# Patient Record
Sex: Male | Born: 1975 | Hispanic: Yes | Marital: Single | Smoking: Never smoker
Health system: Southern US, Community
[De-identification: ages and names within clinical notes are randomized; demographics above are authoritative.]

## PROBLEM LIST (undated history)

## (undated) DIAGNOSIS — R142 Eructation: Secondary | ICD-10-CM

## (undated) DIAGNOSIS — E785 Hyperlipidemia, unspecified: Secondary | ICD-10-CM

## (undated) DIAGNOSIS — E119 Type 2 diabetes mellitus without complications: Secondary | ICD-10-CM

## (undated) DIAGNOSIS — I10 Essential (primary) hypertension: Secondary | ICD-10-CM

## (undated) DIAGNOSIS — F419 Anxiety disorder, unspecified: Secondary | ICD-10-CM

## (undated) DIAGNOSIS — K589 Irritable bowel syndrome without diarrhea: Secondary | ICD-10-CM

## (undated) HISTORY — DX: Anxiety disorder, unspecified: F41.9

## (undated) HISTORY — DX: Irritable bowel syndrome, unspecified: K58.9

## (undated) HISTORY — DX: Essential (primary) hypertension: I10

## (undated) HISTORY — DX: Type 2 diabetes mellitus without complications: E11.9

## (undated) HISTORY — PX: CIRCUMCISION: SUR203

## (undated) HISTORY — PX: GASTRIC BYPASS: SHX52

## (undated) HISTORY — PX: ANKLE SURGERY: SHX546

## (undated) HISTORY — DX: Eructation: R14.2

## (undated) HISTORY — DX: Hyperlipidemia, unspecified: E78.5

---

## 2019-06-26 ENCOUNTER — Ambulatory Visit (INDEPENDENT_AMBULATORY_CARE_PROVIDER_SITE_OTHER): Payer: 59

## 2019-06-26 ENCOUNTER — Ambulatory Visit (INDEPENDENT_AMBULATORY_CARE_PROVIDER_SITE_OTHER): Payer: 59 | Admitting: Internal Medicine

## 2019-06-26 ENCOUNTER — Encounter: Payer: Self-pay | Admitting: *Deleted

## 2019-06-26 ENCOUNTER — Other Ambulatory Visit: Payer: Self-pay

## 2019-06-26 ENCOUNTER — Encounter: Payer: Self-pay | Admitting: Internal Medicine

## 2019-06-26 VITALS — BP 136/97 | HR 82 | Ht 66.0 in | Wt 240.5 lb

## 2019-06-26 DIAGNOSIS — R002 Palpitations: Secondary | ICD-10-CM

## 2019-06-26 DIAGNOSIS — R55 Syncope and collapse: Secondary | ICD-10-CM | POA: Insufficient documentation

## 2019-06-26 DIAGNOSIS — R0602 Shortness of breath: Secondary | ICD-10-CM

## 2019-06-26 DIAGNOSIS — R0789 Other chest pain: Secondary | ICD-10-CM | POA: Diagnosis not present

## 2019-06-26 DIAGNOSIS — R079 Chest pain, unspecified: Secondary | ICD-10-CM

## 2019-06-26 DIAGNOSIS — Z9189 Other specified personal risk factors, not elsewhere classified: Secondary | ICD-10-CM | POA: Diagnosis not present

## 2019-06-26 DIAGNOSIS — E785 Hyperlipidemia, unspecified: Secondary | ICD-10-CM

## 2019-06-26 DIAGNOSIS — E119 Type 2 diabetes mellitus without complications: Secondary | ICD-10-CM

## 2019-06-26 DIAGNOSIS — I10 Essential (primary) hypertension: Secondary | ICD-10-CM

## 2019-06-26 DIAGNOSIS — F411 Generalized anxiety disorder: Secondary | ICD-10-CM | POA: Insufficient documentation

## 2019-06-26 NOTE — Progress Notes (Signed)
New Outpatient Visit Date: 06/26/2019  Primary Care Provider: Mick Sell, MD [address] [place] [address]  Chief Complaint: Lightheadedness, chest pain, and elevated blood pressure  HPI:  Mr. Kehn is a 43 y.o. male who is being seen today for the evaluation of chest pain, palpitations, lightheadedness, shortness of breath, and elevated blood pressure as a self-referral.  He has a history of hypertension, hyperlipidemia, and type 2 diabetes mellitus.  He and his wife moved to [place] from [place], [place], ~6 weeks ago.  Mr. Fingar notes that he has been under quite a bit of stress at work and wonders if that could be contributing to his symptoms.  About 3 weeks ago, he began to feel lightheaded and weak.  He stood up and felt an uncomfortable feeling radiating from his legs up to his chest.  He then developed a "compressive" feeling in his chest.  His blood pressure at the time was 188/103.  He was seen in the [facility] ED, where workup was unrevealing other than elevated blood pressure.  He was given an extra dose of amlodipine and discharged.  He has continued to have similar episodes (albeit without chest pain), most recently last night.  EMS was called to his home yesterday and found his blood pressure to again be elevated but otherwise no objective abnormalities.  His episodes typically last 1-2 hours and are without clear precipitants.  He was given a prescription for lorazepam and sertraline by his PCP earlier today.  Mr. Mcquain notes that he was started on olmesartan 5 mg daily in July before moving to [facility] (though he did not start taking the medication until August).  It was increased to 20 mg daly by his PCP today.  He is also on amlodipine 10 mg daily.  He was previously on metformin and atorvastatin but stopped these medications recently to see if they were contributing to the aforementioned symptoms; he has not noticed any improvement.  Mr. Copland  was evaluated by a cardiologist in [facility] within the last year (Dr. Bradly Bienenstock).  Stress test and echocardiogram reportedly were unremarkable.  At one point, a cardiac catheterization was recommended but never completed as it was not approved by his insurance.  He had a gastric bypass in 2004 and lost a considerable amount of weight.  However, he has gained much of it back.  He had a sleep study at that time and reports that it was negative for OSA.  He has noticed fatigue.  His wife reports that Mr. Recio frequently snores.  --------------------------------------------------------------------------------------------------  Cardiovascular History & Procedures: Cardiovascular Problems:  Palpitations  Chest pain  Shortness of breath   Risk Factors:  Hypertension, hyperlipidemia, diabetes mellitus, male gender, and obesity  Cath/PCI:  None  CV Surgery:  None  EP Procedures and Devices:  None  Non-Invasive Evaluation(s):  Echo and stress test within the last year reportedly normal (reports not available for review)  --------------------------------------------------------------------------------------------------  Past Medical History:  Diagnosis Date  . Diabetes mellitus without complication (HCC)   . Hyperlipidemia   . Hypertension     Past Surgical History:  Procedure Laterality Date  . ANKLE SURGERY     right  . CIRCUMCISION    . GASTRIC BYPASS      Current Meds  Medication Sig  . amLODipine (NORVASC) 10 MG tablet Take by mouth daily.  . diphenhydrAMINE-APAP, sleep, (TYLENOL PM EXTRA STRENGTH PO) Take by mouth daily.  Marland Kitchen LORazepam (ATIVAN) 0.5 MG tablet Take by  mouth as needed.  Marland Kitchen olmesartan (BENICAR) 20 MG tablet Take 20 mg by mouth daily.  . pantoprazole (PROTONIX) 40 MG tablet Take by mouth 2 (two) times daily.  . sertraline (ZOLOFT) 50 MG tablet Take by mouth daily.  . Vitamin D, Ergocalciferol, (DRISDOL) 1.25 MG (50000 UT) CAPS capsule Take by mouth  once a week.    Allergies: Nsaids  Social History   Tobacco Use  . Smoking status: Never Smoker  . Smokeless tobacco: Never Used  Substance Use Topics  . Alcohol use: Yes    Alcohol/week: 40.0 standard drinks    Types: 40 Cans of beer per week  . Drug use: Never    Family History  Problem Relation Age of Onset  . Hypertension Mother   . Heart attack Father 59  . Heart disease Maternal Grandmother   . Diabetes Maternal Grandmother     Review of Systems: A 12-system review of systems was performed and was negative except as noted in the HPI.  --------------------------------------------------------------------------------------------------  Physical Exam: BP (!) 136/97 (BP Location: Right Arm, Patient Position: Sitting, Cuff Size: Large)   Pulse 82   Ht 5\' 6"  (1.676 m)   Wt 240 lb 8 oz (109.1 kg)   SpO2 98%   BMI 38.82 kg/m   General:  NAD.  Accompanied by his wife. HEENT: No conjunctival pallor or scleral icterus. Facemask in place. Neck: Supple without lymphadenopathy, thyromegaly, JVD, or HJR. No carotid bruit. Lungs: Normal work of breathing. Clear to auscultation bilaterally without wheezes or crackles. Heart: Regular rate and rhythm without murmurs, rubs, or gallops. Non-displaced PMI. Abd: Bowel sounds present. Soft, NT/ND without hepatosplenomegaly Ext: No lower extremity edema. Radial, PT, and DP pulses are 2+ bilaterally Skin: Warm and dry without rash. Neuro: CNIII-XII intact. Strength and fine-touch sensation intact in upper and lower extremities bilaterally. Psych: Normal mood and affect.  EKG:  Normal sinus rhythm without abnormality.   --------------------------------------------------------------------------------------------------  ASSESSMENT AND PLAN: Chest pain, shortness of breath, and palpitations: Episodes are happening randomly several days per week.  Palpitations, lightheadedness, and dyspnea are the most prominent features; significant  chest pain only occurred once and led to ED visit at [facility].  We will request records from his prior cardiologist in [place] before repeating any imaging.  Paroxysmal arrhythmia could cause some of these symptoms.  We have agreed to place a 14-day event monitor for further assessment.  If not already done, I recommend that his PCP obtain a TSH to exclude thyroid dysfunction.  I think it is reasonable to treat possible component of anxiety.  I will also refer him to pulmonary for sleep evaluation, given some of his symptoms as well as report of snoring.  Hypertension: BP mildly elevated today.  I agree with increasing olmesartan to 20 mg daily and continuing amlodipine 10 mg daily.  I have recommended sodium restriction and provided information about the DASH diet.  We will refer to pulmonary for consideration of sleep study.  Hyperlipidemia: No lipid panel available for review.  Patient recently stopped atorvastatin on his own.  I will defer ongoing monitoring/managment to his PCP.  Morbid obesity: BMI > 35 with multiple comorbidities (hypertension, hyperlipidemia, diabetes mellitus).  I have recommended weight loss through diet and exercise.  Type 2 diabetes mellitus: Previously on metformin but recently self-discontinues due to constellation of symptoms outline above.  It does not appear that symptoms have improved with stopping this medication.  I suggest that he restart metformin or speak with his PCP  about other treatment options.  Follow-up: RTC 1 month.  Yvonne Kendall, MD 06/27/2019 8:42 PM

## 2019-06-26 NOTE — Patient Instructions (Addendum)
Medication Instructions:  Your physician recommends that you continue on your current medications as directed. Please refer to the Current Medication list given to you today.  If you need a refill on your cardiac medications before your next appointment, please call your pharmacy.   Lab work: NONE If you have labs (blood work) drawn today and your tests are completely normal, you will receive your results only by: Marland Kitchen MyChart Message (if you have MyChart) OR . A paper copy in the mail If you have any lab test that is abnormal or we need to change your treatment, we will call you to review the results.  Testing/Procedures: Your physician has recommended that you wear an 14 DAY ZIO event monitor. Event monitors are medical devices that record the heart's electrical activity. Doctors most often [place] these monitors to diagnose arrhythmias. Arrhythmias are problems with the speed or rhythm of the heartbeat. The monitor is a small, portable device. You can wear one while you do your normal daily activities. This is usually used to diagnose what is causing palpitations/syncope (passing out). A Zio Patch Event Heart monitor will be applied to your chest today.  You will wear the patch for 14 days. After 24 hours, you may shower with the heart monitor on. If you feel any SYMPTOMS, you may press and release the button in the middle of the monitor.   Follow-Up: You have been referred to Pulmonary clinic for sleep evaluation.   At [facility], you and your health needs are our priority.  As part of our continuing mission to provide you with exceptional heart care, we have created designated Provider Care Teams.  These Care Teams include your primary Cardiologist (physician) and Advanced Practice Providers (APPs -  Physician Assistants and Nurse Practitioners) who all work together to provide you with the care you need, when you need it. You will need a follow up appointment in 1 months.  You may see DR  Harrell Gave END or one of the following Advanced Practice Providers on your designated Care Team:   Murray Hodgkins, NP Christell Faith, PA-C . Marrianne Mood, PA-C

## 2019-06-27 ENCOUNTER — Encounter: Payer: Self-pay | Admitting: Internal Medicine

## 2019-06-27 DIAGNOSIS — R002 Palpitations: Secondary | ICD-10-CM | POA: Insufficient documentation

## 2019-06-27 DIAGNOSIS — R079 Chest pain, unspecified: Secondary | ICD-10-CM | POA: Insufficient documentation

## 2019-06-27 DIAGNOSIS — E785 Hyperlipidemia, unspecified: Secondary | ICD-10-CM | POA: Insufficient documentation

## 2019-06-27 DIAGNOSIS — I10 Essential (primary) hypertension: Secondary | ICD-10-CM | POA: Insufficient documentation

## 2019-06-27 DIAGNOSIS — R0602 Shortness of breath: Secondary | ICD-10-CM | POA: Insufficient documentation

## 2019-06-27 DIAGNOSIS — E119 Type 2 diabetes mellitus without complications: Secondary | ICD-10-CM | POA: Insufficient documentation

## 2019-07-03 ENCOUNTER — Other Ambulatory Visit: Payer: Self-pay | Admitting: Infectious Diseases

## 2019-07-03 DIAGNOSIS — R140 Abdominal distension (gaseous): Secondary | ICD-10-CM

## 2019-07-05 ENCOUNTER — Ambulatory Visit
Admission: RE | Admit: 2019-07-05 | Discharge: 2019-07-05 | Disposition: A | Discharge status: Home / Self Care | Payer: 59 | Source: Ambulatory Visit | Attending: Infectious Diseases | Admitting: Infectious Diseases

## 2019-07-05 ENCOUNTER — Other Ambulatory Visit: Payer: Self-pay

## 2019-07-05 DIAGNOSIS — R140 Abdominal distension (gaseous): Secondary | ICD-10-CM | POA: Diagnosis present

## 2019-07-05 MED ORDER — IOHEXOL 300 MG/ML  SOLN
125.0000 mL | Freq: Once | INTRAMUSCULAR | Status: AC | PRN
  Administered 2019-07-05: 14:00:00 125 mL via INTRAVENOUS

## 2019-07-29 ENCOUNTER — Telehealth: Payer: Self-pay

## 2019-07-29 NOTE — Telephone Encounter (Signed)
Patient returning call.

## 2019-07-29 NOTE — Telephone Encounter (Signed)
Attempted to call patient. LMTCB 07/29/2019

## 2019-07-29 NOTE — Telephone Encounter (Signed)
Call to patient to review results from cardiac monitor.   Pt verbalized understanding and had no further questions at this time.   Pt had follow up planned.  Advised pt to call for any further questions or concerns.

## 2019-07-29 NOTE — Telephone Encounter (Signed)
-----   Message from Nelva Bush, MD sent at 07/28/2019  9:18 PM EDT ----- Please let Mr. Mollett know that his monitor showed rare extra beats but no significant arrhythmia to explain his symptoms.  I recommend that he follow-up as planned so we can reassess his symptoms and discuss the need for further testing.

## 2019-07-31 ENCOUNTER — Ambulatory Visit: Payer: 59 | Admitting: Internal Medicine

## 2019-07-31 NOTE — Progress Notes (Deleted)
   Follow-up Outpatient Visit Date: 07/31/2019  Primary Care Provider: Leonel Ramsay, MD [place] [address]  Chief Complaint: ***  HPI:  Jack Flowers is a 43 y.o. year-old male with history of hypertension, hyperlipidemia, and type 2 diabetes mellitus, who presents for follow-up of chest pain, palpitations, and elevated blood pressure.  I met him a month ago, at which time Jack Flowers had multiple complaints that began over the summer and seemed to have been exacerbated by moving to [place] from [place].  We agreed to begin with a [facility] monitor, which showed PACs and PVCs but no significant arrhythmia.  --------------------------------------------------------------------------------------------------  Cardiovascular History & Procedures: Cardiovascular Problems:  Palpitations  Chest pain  Shortness of breath   Risk Factors:  Hypertension, hyperlipidemia, diabetes mellitus, male gender, and obesity  Cath/PCI:  None  CV Surgery:  None  EP Procedures and Devices:  14-day event monitor (06/26/2019): Predominantly sinus rhythm with rare PACs and PVCs.  No significant arrhythmia.  Patient triggered events correspond to sinus rhythm and sinus rhythm with isolated PACs.  Non-Invasive Evaluation(s):  Echo and stress test within the last year reportedly normal (reports not available for review)  Recent CV Pertinent Labs: No results found for: CHOL, HDL, LDLCALC, LDLDIRECT, TRIG, CHOLHDL, INR, BNP, K, MG, BUN, CREATININE  Past medical and surgical history were reviewed and updated in EPIC.  No outpatient medications have been marked as taking for the 07/31/19 encounter (Appointment) with Harly Pipkins, Harrell Gave, MD.    Allergies: Nsaids  Social History   Tobacco Use  . Smoking status: Never Smoker  . Smokeless tobacco: Never Used  Substance Use Topics  . Alcohol use: Yes    Alcohol/week: 40.0 standard drinks    Types: 40 Cans of  beer per week  . Drug use: Never    Family History  Problem Relation Age of Onset  . Hypertension Mother   . Heart attack Father 31  . Heart disease Maternal Grandmother   . Diabetes Maternal Grandmother     Review of Systems: A 12-system review of systems was performed and was negative except as noted in the HPI.  --------------------------------------------------------------------------------------------------  Physical Exam: There were no vitals taken for this visit.  General:  *** HEENT: No conjunctival pallor or scleral icterus. Moist mucous membranes.  OP clear. Neck: Supple without lymphadenopathy, thyromegaly, JVD, or HJR. No carotid bruit. Lungs: Normal work of breathing. Clear to auscultation bilaterally without wheezes or crackles. Heart: Regular rate and rhythm without murmurs, rubs, or gallops. Non-displaced PMI. Abd: Bowel sounds present. Soft, NT/ND without hepatosplenomegaly Ext: No lower extremity edema. Radial, PT, and DP pulses are 2+ bilaterally. Skin: Warm and dry without rash.  EKG:  ***  No results found for: WBC, HGB, HCT, MCV, PLT  No results found for: NA, K, CL, CO2, BUN, CREATININE, GLUCOSE, ALT  No results found for: CHOL, HDL, LDLCALC, LDLDIRECT, TRIG, CHOLHDL  --------------------------------------------------------------------------------------------------  ASSESSMENT AND PLAN: ***  Nelva Bush, MD 07/31/2019 7:26 AM

## 2019-08-01 ENCOUNTER — Ambulatory Visit (INDEPENDENT_AMBULATORY_CARE_PROVIDER_SITE_OTHER): Payer: 59 | Admitting: Internal Medicine

## 2019-08-01 ENCOUNTER — Other Ambulatory Visit: Payer: Self-pay

## 2019-08-01 ENCOUNTER — Encounter: Payer: Self-pay | Admitting: Internal Medicine

## 2019-08-01 VITALS — BP 120/80 | HR 85 | Ht 66.0 in | Wt 242.0 lb

## 2019-08-01 DIAGNOSIS — I10 Essential (primary) hypertension: Secondary | ICD-10-CM | POA: Diagnosis not present

## 2019-08-01 DIAGNOSIS — R002 Palpitations: Secondary | ICD-10-CM

## 2019-08-01 DIAGNOSIS — R079 Chest pain, unspecified: Secondary | ICD-10-CM | POA: Diagnosis not present

## 2019-08-01 MED ORDER — METOPROLOL SUCCINATE ER 25 MG PO TB24: 25.0000 mg | ORAL_TABLET | Freq: Every day | ORAL | 2 refills | Status: DC

## 2019-08-01 NOTE — Progress Notes (Signed)
Follow-up Outpatient Visit Date: 08/01/2019  Primary Care Provider: Leonel Ramsay, MD [place] [address]  Chief Complaint: Follow-up chest pain and palpitations  HPI:  Jack Flowers is a 43 y.o. year-old male with history of hypertension, hyperlipidemia, and type 2 diabetes mellitus, who presents for follow-up of chest pain, palpitations, and elevated blood pressure.  I met him a month ago, at which time Jack Flowers had multiple complaints that began over the summer and seemed to have been exacerbated by moving to [place] from [place].  We agreed to begin with an event monitor, which showed PACs and PVCs but no significant arrhythmia.  Today, Jack Flowers reports feeling better.  He has been using prn lorazepam and also quit his job about 2 weeks ago.  Since then, he has not had any further episodes of chest pain, shortness of breath, or palpitations.  He was recently tried on Zoloft and Lexapro but did not tolerate wither.  He continues to feel somewhat restless/anxious but notes that this goes away with lorazepam.  Blood pressure has also been under better control, usually in the 130's/80's.  --------------------------------------------------------------------------------------------------  Cardiovascular History & Procedures: Cardiovascular Problems:  Palpitations  Chest pain  Shortness of breath   Risk Factors:  Hypertension, hyperlipidemia, diabetes mellitus, male gender, and obesity  Cath/PCI:  None  CV Surgery:  None  EP Procedures and Devices:  14-day event monitor (06/26/2019): Predominantly sinus rhythm with rare PACs and PVCs.  No significant arrhythmia.  Patient triggered events correspond to sinus rhythm and sinus rhythm with isolated PACs.  Non-Invasive Evaluation(s):  Exercise MPI (07/13/2018): Normal study without ischemia or scar.  LVEF 55%.  Exercise MPI (02/08/2017): Normal study without ischemia or scar.   LVEF 78%.  TTE (01/31/2017): Normal LV size and wall thickness.  LVEF 55%.  Normal RV size and function.Trace tricuspid regurgitation.  Normal PA pressure.  Recent CV Pertinent Labs: No results found for: CHOL, HDL, LDLCALC, LDLDIRECT, TRIG, CHOLHDL, INR, BNP, K, MG, BUN, CREATININE  Past medical and surgical history were reviewed and updated in EPIC.  Current Meds  Medication Sig  . amLODipine (NORVASC) 10 MG tablet Take by mouth daily.  . diphenhydrAMINE-APAP, sleep, (TYLENOL PM EXTRA STRENGTH PO) Take by mouth daily.  Marland Kitchen escitalopram (LEXAPRO) 10 MG tablet Take by mouth daily.  Marland Kitchen LORazepam (ATIVAN) 0.5 MG tablet Take by mouth as needed.  . metFORMIN (GLUCOPHAGE) 500 MG tablet Take by mouth daily.  Marland Kitchen olmesartan (BENICAR) 20 MG tablet Take 20 mg by mouth daily.  . Simethicone (GAS-X ULTRA STRENGTH) 180 MG CAPS Take by mouth 3 (three) times daily.  . Vitamin D, Ergocalciferol, (DRISDOL) 1.25 MG (50000 UT) CAPS capsule Take by mouth once a week.    Allergies: Nsaids  Social History   Tobacco Use  . Smoking status: Never Smoker  . Smokeless tobacco: Never Used  Substance Use Topics  . Alcohol use: Yes    Alcohol/week: 40.0 standard drinks    Types: 40 Cans of beer per week  . Drug use: Never    Family History  Problem Relation Age of Onset  . Hypertension Mother   . Heart attack Father 32  . Heart disease Maternal Grandmother   . Diabetes Maternal Grandmother     Review of Systems: A 12-system review of systems was performed and was negative except as noted in the HPI.  --------------------------------------------------------------------------------------------------  Physical Exam: BP 120/80 (BP Location: Left Arm, Patient Position: Sitting, Cuff Size: Large)  Pulse 85   Ht 5' 6"  (1.676 m)   Wt 242 lb (109.8 kg)   SpO2 99%   BMI 39.06 kg/m   General:  NAD. HEENT: No conjunctival pallor or scleral icterus. Facemask in place. Neck: Supple without lymphadenopathy,  thyromegaly, JVD, or HJR. Lungs: Normal work of breathing. Clear to auscultation bilaterally without wheezes or crackles. Heart: Regular rate and rhythm without murmurs, rubs, or gallops. Non-displaced PMI. Abd: Bowel sounds present. Soft, NT/ND without hepatosplenomegaly Ext: No lower extremity edema. Radial, PT, and DP pulses are 2+ bilaterally. Skin: Warm and dry without rash.  EKG:  NSR without abnormality. --------------------------------------------------------------------------------------------------  ASSESSMENT AND PLAN: Chest pain and palpitations: Symptoms have almost completely resolved since Jack Flowers quit his job, which was quite stressful.  He also notes that lorazepam seems to relieve his symptoms as well.  These findings suggest anxiety as the underlying cause of his symptoms.  Recent event monitor did not show any significant arrhythmia, though rare PAC's and PVC's were noted.  I have personally reviewed prior testing from [place] in 2018 and 2019, including echocardiogram and stress test x 2, all of which were normal.  I do not recommend any further cardiac testing at this time.  We have agreed to to a trial of metoprolol succinate 25 mg daily to see if this helps prevent palpitations and improve his anxiety in order to minimize his reliance on lorazepam.  Continued management of anxiety per his PCP.  Hypertension: BP reasonable today.  We will add metoprolol succinate 25 mg daily today.  If blood pressure tolerates, we could try weaning amlodipine and/or olmestartan in the future.  Follow-up: Return to clinic in 1 month.  Nelva Bush, MD 08/02/2019 8:43 PM

## 2019-08-01 NOTE — Patient Instructions (Signed)
Medication Instructions:  Your physician has recommended you make the following change in your medication:  1- START Metoprolol succinate 25 mg by mouth once a day.   *If you need a refill on your cardiac medications before your next appointment, please call your pharmacy*  Lab Work: NONE If you have labs (blood work) drawn today and your tests are completely normal, you will receive your results only by: Marland Kitchen MyChart Message (if you have MyChart) OR . A paper copy in the mail If you have any lab test that is abnormal or we need to change your treatment, we will call you to review the results.  Testing/Procedures: NONE  Follow-Up: At [facility], you and your health needs are our priority.  As part of our continuing mission to provide you with exceptional heart care, we have created designated Provider Care Teams.  These Care Teams include your primary Cardiologist (physician) and Advanced Practice Providers (APPs -  Physician Assistants and Nurse Practitioners) who all work together to provide you with the care you need, when you need it.  Your next appointment:   1 month with APP.  The format for your next appointment:   In Person  Provider:   Murray Hodgkins, NP  Christell Faith, PA

## 2019-08-02 ENCOUNTER — Encounter: Payer: Self-pay | Admitting: Internal Medicine

## 2019-08-28 ENCOUNTER — Other Ambulatory Visit: Payer: Self-pay | Admitting: Acute Care

## 2019-08-28 DIAGNOSIS — R519 Headache, unspecified: Secondary | ICD-10-CM | POA: Insufficient documentation

## 2019-08-28 DIAGNOSIS — G4459 Other complicated headache syndrome: Secondary | ICD-10-CM

## 2019-09-09 ENCOUNTER — Other Ambulatory Visit: Payer: Self-pay

## 2019-09-09 ENCOUNTER — Ambulatory Visit
Admission: RE | Admit: 2019-09-09 | Discharge: 2019-09-09 | Disposition: A | Discharge status: Home / Self Care | Payer: BLUE CROSS/BLUE SHIELD | Source: Ambulatory Visit | Attending: Acute Care | Admitting: Acute Care

## 2019-09-09 DIAGNOSIS — G4459 Other complicated headache syndrome: Secondary | ICD-10-CM | POA: Diagnosis not present

## 2019-09-11 ENCOUNTER — Ambulatory Visit: Payer: 59 | Admitting: Internal Medicine

## 2019-09-11 NOTE — Progress Notes (Deleted)
   Follow-up Outpatient Visit Date: 09/11/2019  Primary Care Provider: Leonel Ramsay, MD [place] [address]  Chief Complaint: ***  HPI:  Mr. Dibartolo is a 43 y.o. male with history of hypertension, hyperlipidemia, and type 2 diabetes mellitus, who presents for follow-up of chest pain, palpitations, and elevated blood pressure.  I last saw him in late October, which time Mr. Liew reported feeling better since quitting his job.  He actually noted that his chest pain, dyspnea, and palpitations had almost completely ceased.  His blood pressure was also under better control.  We agreed to try metoprolol succinate 25 mg daily for treatment of palpitations and blood pressure.  --------------------------------------------------------------------------------------------------  Cardiovascular History & Procedures: Cardiovascular Problems:  Palpitations  Chest pain  Shortness of breath  Risk Factors:  Hypertension, hyperlipidemia, diabetes mellitus, male gender, and obesity  Cath/PCI:  None  CV Surgery:  None  EP Procedures and Devices:  14-day event monitor (06/26/2019): Predominantly sinus rhythm with rare PACs and PVCs.  No significant arrhythmia.  Patient triggered events correspond to sinus rhythm and sinus rhythm with isolated PACs.  Non-Invasive Evaluation(s):  Exercise MPI (07/13/2018): Normal study without ischemia or scar.  LVEF 55%.  Exercise MPI (02/08/2017): Normal study without ischemia or scar.  LVEF 78%.  TTE (01/31/2017): Normal LV size and wall thickness.  LVEF 55%.  Normal RV size and function.Trace tricuspid regurgitation.  Normal PA pressure.  Recent CV Pertinent Labs: No results found for: CHOL, HDL, LDLCALC, LDLDIRECT, TRIG, CHOLHDL, INR, BNP, K, MG, BUN, CREATININE  Past medical and surgical history were reviewed and updated in EPIC.  No outpatient medications have been marked as taking for the 09/11/19 encounter  (Appointment) with Thao Vanover, Harrell Gave, MD.    Allergies: Nsaids  Social History   Tobacco Use  . Smoking status: Never Smoker  . Smokeless tobacco: Never Used  Substance Use Topics  . Alcohol use: Yes    Alcohol/week: 40.0 standard drinks    Types: 40 Cans of beer per week  . Drug use: Never    Family History  Problem Relation Age of Onset  . Hypertension Mother   . Heart attack Father 38  . Heart disease Maternal Grandmother   . Diabetes Maternal Grandmother     Review of Systems: A 12-system review of systems was performed and was negative except as noted in the HPI.  --------------------------------------------------------------------------------------------------  Physical Exam: There were no vitals taken for this visit.  General:  *** HEENT: No conjunctival pallor or scleral icterus. Facemask in place. Neck: Supple without lymphadenopathy, thyromegaly, JVD, or HJR. Lungs: Normal work of breathing. Clear to auscultation bilaterally without wheezes or crackles. Heart: Regular rate and rhythm without murmurs, rubs, or gallops. Non-displaced PMI. Abd: Bowel sounds present. Soft, NT/ND without hepatosplenomegaly Ext: No lower extremity edema. Radial, PT, and DP pulses are 2+ bilaterally. Skin: Warm and dry without rash.  EKG:  ***  No results found for: WBC, HGB, HCT, MCV, PLT  No results found for: NA, K, CL, CO2, BUN, CREATININE, GLUCOSE, ALT  No results found for: CHOL, HDL, LDLCALC, LDLDIRECT, TRIG, CHOLHDL  --------------------------------------------------------------------------------------------------  ASSESSMENT AND PLAN: ***  Nelva Bush, MD 09/11/2019 7:38 AM

## 2019-09-24 ENCOUNTER — Institutional Professional Consult (permissible substitution): Payer: 59 | Admitting: Pulmonary Disease

## 2019-10-15 ENCOUNTER — Other Ambulatory Visit: Payer: Self-pay

## 2019-10-15 ENCOUNTER — Inpatient Hospital Stay: Payer: BLUE CROSS/BLUE SHIELD

## 2019-10-15 ENCOUNTER — Encounter: Payer: Self-pay | Admitting: Oncology

## 2019-10-15 ENCOUNTER — Inpatient Hospital Stay: Payer: BLUE CROSS/BLUE SHIELD | Attending: Oncology | Admitting: Oncology

## 2019-10-15 ENCOUNTER — Telehealth: Payer: Self-pay | Admitting: *Deleted

## 2019-10-15 VITALS — BP 127/99 | HR 101 | Temp 98.8°F | Ht 66.0 in | Wt 246.0 lb

## 2019-10-15 DIAGNOSIS — R531 Weakness: Secondary | ICD-10-CM | POA: Insufficient documentation

## 2019-10-15 DIAGNOSIS — R790 Abnormal level of blood mineral: Secondary | ICD-10-CM

## 2019-10-15 DIAGNOSIS — R7989 Other specified abnormal findings of blood chemistry: Secondary | ICD-10-CM | POA: Diagnosis present

## 2019-10-15 DIAGNOSIS — E785 Hyperlipidemia, unspecified: Secondary | ICD-10-CM | POA: Insufficient documentation

## 2019-10-15 DIAGNOSIS — E119 Type 2 diabetes mellitus without complications: Secondary | ICD-10-CM | POA: Insufficient documentation

## 2019-10-15 DIAGNOSIS — I10 Essential (primary) hypertension: Secondary | ICD-10-CM | POA: Diagnosis not present

## 2019-10-15 DIAGNOSIS — E7800 Pure hypercholesterolemia, unspecified: Secondary | ICD-10-CM | POA: Diagnosis not present

## 2019-10-15 DIAGNOSIS — Z9884 Bariatric surgery status: Secondary | ICD-10-CM | POA: Insufficient documentation

## 2019-10-15 DIAGNOSIS — R5383 Other fatigue: Secondary | ICD-10-CM | POA: Insufficient documentation

## 2019-10-15 DIAGNOSIS — Z794 Long term (current) use of insulin: Secondary | ICD-10-CM

## 2019-10-15 DIAGNOSIS — F419 Anxiety disorder, unspecified: Secondary | ICD-10-CM | POA: Diagnosis not present

## 2019-10-15 DIAGNOSIS — Z79899 Other long term (current) drug therapy: Secondary | ICD-10-CM | POA: Diagnosis not present

## 2019-10-15 DIAGNOSIS — K589 Irritable bowel syndrome without diarrhea: Secondary | ICD-10-CM | POA: Diagnosis not present

## 2019-10-15 LAB — CBC
HCT: 40.3 % (ref 39.0–52.0)
Hemoglobin: 12.9 g/dL — ABNORMAL LOW (ref 13.0–17.0)
MCH: 27.9 pg (ref 26.0–34.0)
MCHC: 32 g/dL (ref 30.0–36.0)
MCV: 87 fL (ref 80.0–100.0)
Platelets: 289 10*3/uL (ref 150–400)
RBC: 4.63 MIL/uL (ref 4.22–5.81)
RDW: 13.2 % (ref 11.5–15.5)
WBC: 7.1 10*3/uL (ref 4.0–10.5)
nRBC: 0 % (ref 0.0–0.2)

## 2019-10-15 LAB — COMPREHENSIVE METABOLIC PANEL
ALT: 49 U/L — ABNORMAL HIGH (ref 0–44)
AST: 38 U/L (ref 15–41)
Albumin: 4.2 g/dL (ref 3.5–5.0)
Alkaline Phosphatase: 79 U/L (ref 38–126)
Anion gap: 9 (ref 5–15)
BUN: 17 mg/dL (ref 6–20)
CO2: 23 mmol/L (ref 22–32)
Calcium: 9.2 mg/dL (ref 8.9–10.3)
Chloride: 106 mmol/L (ref 98–111)
Creatinine, Ser: 0.93 mg/dL (ref 0.61–1.24)
GFR calc Af Amer: >60 mL/min (ref >60)
GFR calc non Af Amer: >60 mL/min (ref >60)
Glucose, Bld: 110 mg/dL — ABNORMAL HIGH (ref 70–99)
Potassium: 4.3 mmol/L (ref 3.5–5.1)
Sodium: 138 mmol/L (ref 135–145)
Total Bilirubin: 0.7 mg/dL (ref 0.3–1.2)
Total Protein: 7.7 g/dL (ref 6.5–8.1)

## 2019-10-15 NOTE — Telephone Encounter (Signed)
I called the pt back about his labs. We were going to draw the labs that pt. Needs to have for our office and his PCP office. The PCP wanted met c, lipid panel and hgb a1c. Dr Janese Banks wants CBC. I called PCP office if we could draw labs and they will call me back. I ordered the labs and then got the message from PCP that pt can't have the labs done until 1/26 in order for his insurance to pay for it. I cancelled the lipid panel and the hb a1c. I then called pt to let him know about this and he is in agreement to cancel the labs. We will keep the cbc and metc.

## 2019-10-15 NOTE — Progress Notes (Signed)
Patient is here today to establish care for low ferritin. 

## 2019-10-18 ENCOUNTER — Encounter: Payer: Self-pay | Admitting: Oncology

## 2019-10-18 NOTE — Progress Notes (Signed)
Hematology/Oncology Consult note [facility] [phone] [phone]  Patient Care Team: Leonel Ramsay, MD as PCP - General (Infectious Diseases)   Name of the patient: Jack Flowers  [id number]  July 29, 1976    Reason for referral-low ferritin   Referring physician-Dr. Kristine Linea  Date of visit: 10/18/19   History of presenting illness- Patient is a 44 year old Hispanic male with a prior history of gastric bypass.  He has been referred to [place] by neurology for his low ferritin.  His past medical history significant for hypertension hyperlipidemia and type 2 diabetes.  He had his prior GI work-up in [place].  Reports that back in March 2020 he had a normal hemoglobin.  He denies currently any blood in his stool or urine.  He did have both EGD and colonoscopy last year in 2020 he recently had iron studies on 08/27/2019 which showed a ferritin of 14.  No CBC has been checked recently.  ECOG PS- 0  Pain scale- 0   Review of systems- Review of Systems  Constitutional: Positive for malaise/fatigue. Negative for chills, fever and weight loss.  HENT: Negative for congestion, ear discharge and nosebleeds.   Eyes: Negative for blurred vision.  Respiratory: Negative for cough, hemoptysis, sputum production, shortness of breath and wheezing.   Cardiovascular: Negative for chest pain, palpitations, orthopnea and claudication.  Gastrointestinal: Negative for abdominal pain, blood in stool, constipation, diarrhea, heartburn, melena, nausea and vomiting.  Genitourinary: Negative for dysuria, flank pain, frequency, hematuria and urgency.  Musculoskeletal: Negative for back pain, joint pain and myalgias.  Skin: Negative for rash.  Neurological: Negative for dizziness, tingling, focal weakness, seizures, weakness and headaches.  Endo/Heme/Allergies: Does not bruise/bleed easily.  Psychiatric/Behavioral: Negative for depression and suicidal ideas.  The patient does not have insomnia.     Allergies  Allergen Reactions  . Nsaids     Other reaction(s): Other (See Comments) Gastric bypass    Patient Active Problem List   Diagnosis Date Noted  . Headache disorder 08/28/2019  . Chest pain of uncertain etiology 35/00/9381  . Palpitations 06/27/2019  . Shortness of breath 06/27/2019  . Essential hypertension 06/27/2019  . Hyperlipidemia 06/27/2019  . Morbid obesity ([place]) 06/27/2019  . Type 2 diabetes mellitus without complication, without long-term current use of insulin ([place]) 06/27/2019  . GAD (generalized anxiety disorder) 06/26/2019  . Pre-syncope 06/26/2019     Past Medical History:  Diagnosis Date  . Anxiety   . Belching   . Diabetes mellitus without complication ([employer])   . Hyperlipidemia   . Hypertension   . IBS (irritable bowel syndrome)    Diarrhea     Past Surgical History:  Procedure Laterality Date  . ANKLE SURGERY     right  . CIRCUMCISION    . GASTRIC BYPASS      Social History   Socioeconomic History  . Marital status: Single    Spouse name: Not on file  . Number of children: Not on file  . Years of education: Not on file  . Highest education level: Not on file  Occupational History  . Not on file  Tobacco Use  . Smoking status: Never Smoker  . Smokeless tobacco: Never Used  Substance and Sexual Activity  . Alcohol use: Yes    Alcohol/week: 40.0 standard drinks    Types: 40 Cans of beer per week  . Drug use: Never  . Sexual activity: Not on file  Other Topics Concern  . Not on file  Social History Narrative  . Not on file   Social Determinants of Health   Financial Resource Strain:   . Difficulty of Paying Living Expenses: Not on file  Food Insecurity:   . Worried About Programme researcher, broadcasting/film/video in the Last Year: Not on file  . Ran Out of Food in the Last Year: Not on file  Transportation Needs:   . Lack of Transportation (Medical): Not on file  . Lack of Transportation (Non-Medical):  Not on file  Physical Activity:   . Days of Exercise per Week: Not on file  . Minutes of Exercise per Session: Not on file  Stress:   . Feeling of Stress : Not on file  Social Connections:   . Frequency of Communication with Friends and Family: Not on file  . Frequency of Social Gatherings with Friends and Family: Not on file  . Attends Religious Services: Not on file  . Active Member of Clubs or Organizations: Not on file  . Attends Banker Meetings: Not on file  . Marital Status: Not on file  Intimate Partner Violence:   . Fear of Current or Ex-Partner: Not on file  . Emotionally Abused: Not on file  . Physically Abused: Not on file  . Sexually Abused: Not on file     Family History  Problem Relation Age of Onset  . Hypertension Mother   . Heart attack Father 1  . Heart disease Maternal Grandmother   . Diabetes Maternal Grandmother   . Thyroid cancer Other   . Cancer Other      Current Outpatient Medications:  .  amLODipine (NORVASC) 10 MG tablet, Take by mouth daily., Disp: , Rfl:  .  diphenhydrAMINE-APAP, sleep, (TYLENOL PM EXTRA STRENGTH PO), Take by mouth daily., Disp: , Rfl:  .  LORazepam (ATIVAN) 0.5 MG tablet, Take 1 mg by mouth 2 (two) times daily. , Disp: , Rfl:  .  metFORMIN (GLUCOPHAGE) 500 MG tablet, Take by mouth daily., Disp: , Rfl:  .  olmesartan (BENICAR) 20 MG tablet, Take 20 mg by mouth daily., Disp: , Rfl:  .  Simethicone (GAS-X ULTRA STRENGTH) 180 MG CAPS, Take by mouth 3 (three) times daily., Disp: , Rfl:  .  Vitamin D, Ergocalciferol, (DRISDOL) 1.25 MG (50000 UT) CAPS capsule, Take by mouth once a week., Disp: , Rfl:    Physical exam:  Vitals:   10/15/19 1511  BP: (!) 127/99  Pulse: (!) 101  Temp: 98.8 F (37.1 C)  TempSrc: Tympanic  SpO2: 100%  Weight: 246 lb (111.6 kg)  Height: 5\' 6"  (1.676 m)   Physical Exam Constitutional:      General: He is not in acute distress. HENT:     Head: Normocephalic and atraumatic.  Eyes:       Pupils: Pupils are equal, round, and reactive to light.  Cardiovascular:     Rate and Rhythm: Normal rate and regular rhythm.     Heart sounds: Normal heart sounds.  Pulmonary:     Effort: Pulmonary effort is normal.     Breath sounds: Normal breath sounds.  Abdominal:     General: Bowel sounds are normal.     Palpations: Abdomen is soft.  Musculoskeletal:     Cervical back: Normal range of motion.  Skin:    General: Skin is warm and dry.  Neurological:     Mental Status: He is alert and oriented to person, place, and time.        CMP  Latest Ref Rng & Units 10/15/2019  Glucose 70 - 99 mg/dL 098(J)  BUN 6 - 20 mg/dL 17  Creatinine 1.91 - 4.78 mg/dL 2.95  Sodium 621 - 308 mmol/L 138  Potassium 3.5 - 5.1 mmol/L 4.3  Chloride 98 - 111 mmol/L 106  CO2 22 - 32 mmol/L 23  Calcium 8.9 - 10.3 mg/dL 9.2  Total Protein 6.5 - 8.1 g/dL 7.7  Total Bilirubin 0.3 - 1.2 mg/dL 0.7  Alkaline Phos 38 - 126 U/L 79  AST 15 - 41 U/L 38  ALT 0 - 44 U/L 49(H)   CBC Latest Ref Rng & Units 10/15/2019  WBC 4.0 - 10.5 K/uL 7.1  Hemoglobin 13.0 - 17.0 g/dL 12.9(L)  Hematocrit 39.0 - 52.0 % 40.3  Platelets 150 - 400 K/uL 289     Assessment and plan- Patient is a 43 y.o. male refer follow-up of ferritin which I suspect is secondary to gastric bypass  I have reviewed labs done at [facility] clinic.  He will not have access to his EGD and colonoscopy from 2020.  In November 2020 patient was found to have a low ferritin level.  His CBC has not been checked at that time.Today I will plan to get a CBC with differential.  If he is found to have significant anemia I will plan to give him IV iron given his low ferritin.  However if his hemoglobin is near normal I will avoid giving him IV iron to reduce the number of clinic visits.  I would like him to try oral iron in that case.  I will repeat CBC, ferritin and iron studies B12 and folate in 3 months time.  If his ferritin levels continue to be low despite  trial of oral iron I will switch him to IV iron at that time.  Patient verbalized understanding   Thank you for this kind referral and the opportunity to participate in the care of this  Patient   Visit Diagnosis 1. Low ferritin   2. History of gastric bypass   3. Type 2 diabetes mellitus without complication, with long-term current use of insulin (HCC)   4. High cholesterol     Dr. Owens Shark, MD, MPH [facility] at [facility] [phone] 10/18/2019

## 2019-10-28 ENCOUNTER — Ambulatory Visit: Payer: BLUE CROSS/BLUE SHIELD | Attending: Internal Medicine

## 2019-10-28 DIAGNOSIS — Z20822 Contact with and (suspected) exposure to covid-19: Secondary | ICD-10-CM

## 2019-10-29 LAB — NOVEL CORONAVIRUS, NAA: SARS-CoV-2, NAA: NOT DETECTED

## 2019-11-27 ENCOUNTER — Other Ambulatory Visit: Payer: Self-pay

## 2019-11-27 ENCOUNTER — Emergency Department
Admission: EM | Admit: 2019-11-27 | Discharge: 2019-11-27 | Disposition: A | Discharge status: Home / Self Care | Payer: BLUE CROSS/BLUE SHIELD | Attending: Student | Admitting: Student

## 2019-11-27 ENCOUNTER — Encounter: Payer: Self-pay | Admitting: Emergency Medicine

## 2019-11-27 ENCOUNTER — Emergency Department: Payer: BLUE CROSS/BLUE SHIELD

## 2019-11-27 DIAGNOSIS — Z7984 Long term (current) use of oral hypoglycemic drugs: Secondary | ICD-10-CM | POA: Insufficient documentation

## 2019-11-27 DIAGNOSIS — F419 Anxiety disorder, unspecified: Secondary | ICD-10-CM | POA: Insufficient documentation

## 2019-11-27 DIAGNOSIS — I10 Essential (primary) hypertension: Secondary | ICD-10-CM | POA: Insufficient documentation

## 2019-11-27 DIAGNOSIS — R079 Chest pain, unspecified: Secondary | ICD-10-CM

## 2019-11-27 DIAGNOSIS — E119 Type 2 diabetes mellitus without complications: Secondary | ICD-10-CM | POA: Diagnosis not present

## 2019-11-27 DIAGNOSIS — Z79899 Other long term (current) drug therapy: Secondary | ICD-10-CM | POA: Diagnosis not present

## 2019-11-27 DIAGNOSIS — R200 Anesthesia of skin: Secondary | ICD-10-CM

## 2019-11-27 LAB — CBC
HCT: 38.6 % — ABNORMAL LOW (ref 39.0–52.0)
Hemoglobin: 12.6 g/dL — ABNORMAL LOW (ref 13.0–17.0)
MCH: 27.9 pg (ref 26.0–34.0)
MCHC: 32.6 g/dL (ref 30.0–36.0)
MCV: 85.6 fL (ref 80.0–100.0)
Platelets: 236 10*3/uL (ref 150–400)
RBC: 4.51 MIL/uL (ref 4.22–5.81)
RDW: 12.4 % (ref 11.5–15.5)
WBC: 6.8 10*3/uL (ref 4.0–10.5)
nRBC: 0 % (ref 0.0–0.2)

## 2019-11-27 LAB — BASIC METABOLIC PANEL
Anion gap: 8 (ref 5–15)
BUN: 16 mg/dL (ref 6–20)
CO2: 25 mmol/L (ref 22–32)
Calcium: 9.1 mg/dL (ref 8.9–10.3)
Chloride: 105 mmol/L (ref 98–111)
Creatinine, Ser: 0.9 mg/dL (ref 0.61–1.24)
GFR calc Af Amer: >60 mL/min (ref >60)
GFR calc non Af Amer: >60 mL/min (ref >60)
Glucose, Bld: 129 mg/dL — ABNORMAL HIGH (ref 70–99)
Potassium: 4.2 mmol/L (ref 3.5–5.1)
Sodium: 138 mmol/L (ref 135–145)

## 2019-11-27 LAB — TROPONIN I (HIGH SENSITIVITY)
Troponin I (High Sensitivity): 3 ng/L (ref <18)
Troponin I (High Sensitivity): 4 ng/L (ref <18)

## 2019-11-27 NOTE — ED Provider Notes (Signed)
[facility] Emergency Department Provider Note  ____________________________________________   First MD Initiated Contact with Patient 11/27/19 1811     (approximate)  I have reviewed the triage vital signs and the nursing notes.  History  Chief Complaint Chest Pain    HPI Jack Flowers is a 44 y.o. male with a history of HTN, anxiety, obesity status post gastric bypass, who presents to the emergency department for an episode of chest discomfort.  Patient states he was driving his car this afternoon when all of a sudden he experienced pain across his anterior chest. Described as a tightness, moderate/severe, no radiation, worsened by anxiety, and improved with Ativan.  This was associated with whole body tingling and anxiety.  Patient states he has a history of anxiety attacks, with similar symptoms.  However, the chest discomfort gave him more anxiety as he was concerned he was having heart attack which only worsened his symptoms.  He pulled his car over, took one of his prescribed Ativan, with significant improvement in his symptoms.  On arrival to the emergency department he is feeling much improved.   Past Medical Hx Past Medical History:  Diagnosis Date  . Anxiety   . Belching   . Diabetes mellitus without complication (HCC)   . Hyperlipidemia   . Hypertension   . IBS (irritable bowel syndrome)    Diarrhea    Problem List Patient Active Problem List   Diagnosis Date Noted  . Headache disorder 08/28/2019  . Chest pain of uncertain etiology 06/27/2019  . Palpitations 06/27/2019  . Shortness of breath 06/27/2019  . Essential hypertension 06/27/2019  . Hyperlipidemia 06/27/2019  . Morbid obesity (HCC) 06/27/2019  . Type 2 diabetes mellitus without complication, without long-term current use of insulin (HCC) 06/27/2019  . GAD (generalized anxiety disorder) 06/26/2019  . Pre-syncope 06/26/2019    Past Surgical Hx Past Surgical History:    Procedure Laterality Date  . ANKLE SURGERY     right  . CIRCUMCISION    . GASTRIC BYPASS      Medications Prior to Admission medications   Medication Sig Start Date End Date Taking? Authorizing Provider  amLODipine (NORVASC) 10 MG tablet Take by mouth daily.    [provider]  diphenhydrAMINE-APAP, sleep, (TYLENOL PM EXTRA STRENGTH PO) Take by mouth daily.    [provider]  LORazepam (ATIVAN) 0.5 MG tablet Take 1 mg by mouth 2 (two) times daily.     [provider]  metFORMIN (GLUCOPHAGE) 500 MG tablet Take by mouth daily.    [provider]  olmesartan (BENICAR) 20 MG tablet Take 20 mg by mouth daily.    [provider]  Simethicone (GAS-X ULTRA STRENGTH) 180 MG CAPS Take by mouth 3 (three) times daily.    [provider]  Vitamin D, Ergocalciferol, (DRISDOL) 1.25 MG (50000 UT) CAPS capsule Take by mouth once a week.    [provider]    Allergies Nsaids  Family Hx Family History  Problem Relation Age of Onset  . Hypertension Mother   . Heart attack Father 47  . Heart disease Maternal Grandmother   . Diabetes Maternal Grandmother   . Thyroid cancer Other   . Cancer Other     Social Hx Social History   Tobacco Use  . Smoking status: Never Smoker  . Smokeless tobacco: Never Used  Substance Use Topics  . Alcohol use: Yes    Alcohol/week: 40.0 standard drinks    Types: 40 Cans of  beer per week  . Drug use: Never     Review of Systems  Constitutional: Negative for fever, chills. Eyes: Negative for visual changes. ENT: Negative for sore throat. Cardiovascular: Positive for chest pain. Respiratory: Negative for shortness of breath. Gastrointestinal: Negative for nausea, vomiting.  Genitourinary: Negative for dysuria. Musculoskeletal: Negative for leg swelling. Skin: Negative for rash. Neurological: Negative for headaches.  Positive for numbness/tingling and anxiety.   Physical Exam  Vital  Signs: ED Triage Vitals  Enc Vitals Group     BP 11/27/19 1645 (!) 147/93     Pulse Rate 11/27/19 1645 (!) 106     Resp 11/27/19 1645 20     Temp 11/27/19 1645 98.9 F (37.2 C)     Temp Source 11/27/19 1645 Oral     SpO2 11/27/19 1645 99 %     Weight 11/27/19 1645 240 lb (108.9 kg)     Height 11/27/19 1645 5\' 6"  (1.676 m)     Head Circumference --      Peak Flow --      Pain Score 11/27/19 1714 0     Pain Loc --      Pain Edu? --      Excl. in [place]? --     Constitutional: Alert and oriented.  Head: Normocephalic. Atraumatic. Eyes: Conjunctivae clear. Sclera anicteric. Nose: No congestion. No rhinorrhea. Mouth/Throat: Wearing mask.  Neck: No stridor.   Cardiovascular: Normal rate, regular rhythm. Extremities well perfused. Respiratory: Normal respiratory effort.  Lungs CTAB. Gastrointestinal: Soft. Non-tender. Non-distended.  Musculoskeletal: No lower extremity edema. No deformities. Neurologic:  Normal speech and language. No gross focal neurologic deficits are appreciated.  Skin: Skin is warm, dry and intact. No rash noted. Psychiatric: Mood and affect are appropriate for situation.  Calm and relaxed.  EKG  Personally reviewed.   Rate: 108 Rhythm: sinus Axis: normal Intervals: WNL No acute ischemic changes Sinus tachycardia No STEMI    Radiology  CXR: IMPRESSION:  Normal chest    Procedures  Procedure(s) performed (including critical care):  Procedures   Initial Impression / Assessment and Plan / ED Course  44 y.o. male who presents to the ED for an episode of chest discomfort, numbness, anxiety, as above.  Ddx: high suspicion for anxiety, also consider atypical ACS, hyperventilation, pleurisy.  No focal neurological signs or symptoms to suggest intracranial etiology.  Will evaluate with labs, EKG.  EKG as above, no acute ischemic changes.  Initial HR improved on recheck spontaneously, with relaxing.  Labs without actionable derangements.  Troponin  x 2 negative.  As such, given negative work-up, patient stable for discharge with outpatient follow-up.  He is agreeable with the plan.  Given return precautions.   Final Clinical Impression(s) / ED Diagnosis  Final diagnoses:  Chest pain in adult  Numbness  Anxiety       Note:  This document was prepared using Dragon voice recognition software and may include unintentional dictation errors.   Lilia Pro., MD 11/27/19 [phone]

## 2019-11-27 NOTE — ED Notes (Signed)
Patient discharged to home per MD order. Patient in stable condition, and deemed medically cleared by ED provider for discharge. Discharge instructions reviewed with patient/family using "Teach Back"; verbalized understanding of medication education and administration, and information about follow-up care. Denies further concerns. ° °

## 2019-11-27 NOTE — Discharge Instructions (Addendum)
Thank you for letting us take care of you in the emergency department today.  ° °Please continue to take any regular, prescribed medications.  ° °Please follow up with: °- Your primary care doctor to review your ER visit and follow up on your symptoms.  ° °Please return to the ER for any new or worsening symptoms.  ° °

## 2019-11-27 NOTE — ED Triage Notes (Signed)
Patient presents to the ED via EMS from the roadside.  Patient reports he was driving on the highway and suddenly felt tingling in his face and hands.  Patient pulled over and had shortness of breath and chest pain.  Patient has history of panic attacks and has a prescription for ativan as needed.  Patient took ativan after episode and is now feeling better.  Denies chest pain and shortness of breath at this time.  Reports some remaining tingling in hands.

## 2020-01-08 ENCOUNTER — Ambulatory Visit (HOSPITAL_COMMUNITY): Payer: 59 | Admitting: Professional

## 2020-01-11 ENCOUNTER — Encounter (HOSPITAL_COMMUNITY): Payer: Self-pay | Admitting: Psychiatry

## 2020-01-11 ENCOUNTER — Ambulatory Visit (INDEPENDENT_AMBULATORY_CARE_PROVIDER_SITE_OTHER): Payer: BLUE CROSS/BLUE SHIELD | Admitting: Psychiatry

## 2020-01-11 ENCOUNTER — Other Ambulatory Visit: Payer: Self-pay

## 2020-01-11 VITALS — Wt 242.0 lb

## 2020-01-11 DIAGNOSIS — F411 Generalized anxiety disorder: Secondary | ICD-10-CM

## 2020-01-11 DIAGNOSIS — F410 Panic disorder [episodic paroxysmal anxiety] without agoraphobia: Secondary | ICD-10-CM | POA: Diagnosis not present

## 2020-01-11 MED ORDER — NORTRIPTYLINE HCL 50 MG PO CAPS: 50.0000 mg | ORAL_CAPSULE | Freq: Every day | ORAL | 0 refills | Status: DC

## 2020-01-11 NOTE — Progress Notes (Signed)
Virtual Visit via Video Note  I connected with Jack Flowers on 01/11/20 at  9:00 AM EDT by a video enabled telemedicine application and verified that I am speaking with the correct person using two identifiers.   I discussed the limitations of evaluation and management by telemedicine and the availability of in person appointments. The patient expressed understanding and agreed to proceed.   [facility] Initial [facility]  Jack Flowers [id number] 44 y.o.  01/11/2020 9:48 AM  Chief Complaint:  I have anxiety attack.  My doctor asked me to see psychiatrist.  History of Present Illness:  Patient is 44 year old married employed man who is referred from his PCP Dr. Sampson GoonFitzgerald for the management of anxiety symptoms.  Patient endorsed history of anxiety and panic attack for more than a year and initially presented with hypertension and chest pain and family doctor diagnosed with panic attacks.  He recall the symptoms started when he was in [place] and has been to the ER twice because of feeling having a chest pain and heart attack.  When he moved to [place] in July 2020 he continued to have the symptoms.  His job ended in October 2020 but he was able to get back his job in March 2021.  In the past few months he noticed his symptoms are getting worse.  He feels these attacks more frequently.  He reported having chest pain, shortness of breath, passing out, dizziness, leg shaking, feeling of doom and afraid of dying.  Usually these attacks last for few minutes and subsided when he takes the lorazepam.  His physician started lorazepam in October 2020 but lately he has to take more frequently as he having more panic attacks.  He admitted these attacks subsided with the lorazepam but he is so concerned that the Stacks may happen again.  He reported his job performance, personal life has been very effective.  He does not go outside.  He is afraid to drive and feels very tired and  think negative about everything.  He has these attacks while he was in a grocery store and also one time when he was driving on interstate.  He feels sometimes afraid to go by himself.  He is working in [employer] and there is no direct stress but he thinks about everything including his job.  I also spoke to his wife who was sitting next to him.  Apparently no specific stressors that triggers these attacks.  He also have multiple health issues.  He has history of gastric bypass, hypertension, obesity, diabetes.  His cardiologist tried him on metoprolol to help with those anxiety and blood pressure but he could not tolerate.  He is taking 2 antihypertensive medication.  His GI recommended recently to take nortriptyline to help belching, passing flatus and other chronic GI symptoms.  He feels taking the nortriptyline 20 mg help his sleep and some anxiety and lately he has cut down his lorazepam to take as needed.  He had tried Zoloft for 4 weeks but causes headaches.  Then he tried Lexapro for few days but he was scared that it causing the same side effects.  Denies any paranoia, hallucination, suicidal thoughts.  He denies any crying spells or any feeling of hopelessness.  Denies any anger, mood swing, mania, nightmares, history of abuse.  Lives with his wife and he has 4 kids.  He has limited social network since moved from [place] for a job and not able to establish network.  All  his family lives in [place].  He admitted drinking alcohol mostly beer 4-5 a day.  Though he denies any intoxication, blackouts or any withdrawal symptoms but admitted it does help him to calm down.  His current medicines are Metformin, trazodone, 2 antihypertensive medication, Ativan, nortriptyline and vitamins.   Past Psychiatric History: History of anxiety and panic attacks.  Has been in ER multiple times for chest pain anxiety attacks.  PCP tried Zoloft for 4 weeks but stopped due to headaches.  Tried Lexapro for few days.  Given  trazodone to help sleep.  Recently GI prescribed nortriptyline to help IBS.  No history of suicidal attempt, inpatient treatment, abuse, mania or psychosis.  Family History: Denies any family history of psychiatric illness.  Past Medical History:  Diagnosis Date  . Anxiety   . Belching   . Diabetes mellitus without complication (HCC)   . Hyperlipidemia   . Hypertension   . IBS (irritable bowel syndrome)    Diarrhea     Traumatic brain injury: Denies any history of traumatic brain injury.  Work History; Working in a company and job started recently 3 weeks ago.  Has been working on and off for many years.  Psychosocial History; Patient is married and has 4 kids who are 51 year old, 80 year old, 81 year old and 2 year old.  Wife is very supportive.  All his family is in [place].  Legal History; Denies any legal issues.  History Of Abuse; Denies any history of abuse.  Substance Abuse History; Admitted drinking beer 4-5 every day but denies any intoxication, blackouts, seizures, DUI or any other illegal substances.  Neurologic: Headache: No Seizure: No Paresthesias: No   Outpatient Encounter Medications as of 01/11/2020  Medication Sig  . amLODipine (NORVASC) 10 MG tablet Take by mouth daily.  . diphenhydrAMINE-APAP, sleep, (TYLENOL PM EXTRA STRENGTH PO) Take by mouth daily.  Marland Kitchen LORazepam (ATIVAN) 1 MG tablet Take by mouth.  . nortriptyline (PAMELOR) 50 MG capsule Take 1 capsule (50 mg total) by mouth at bedtime.  Marland Kitchen olmesartan (BENICAR) 20 MG tablet Take 20 mg by mouth daily.  . RABEprazole (ACIPHEX) 20 MG tablet Take by mouth.  . Simethicone (GAS-X ULTRA STRENGTH) 180 MG CAPS Take by mouth 3 (three) times daily.  . Vitamin D, Ergocalciferol, (DRISDOL) 1.25 MG (50000 UT) CAPS capsule Take by mouth once a week.  . [DISCONTINUED] nortriptyline (PAMELOR) 10 MG capsule Take by mouth.  . metFORMIN (GLUCOPHAGE) 500 MG tablet Take by mouth daily.  . traZODone (DESYREL) 50 MG  tablet Take by mouth.  . [DISCONTINUED] LORazepam (ATIVAN) 0.5 MG tablet Take 1 mg by mouth 2 (two) times daily.    No facility-administered encounter medications on file as of 01/11/2020.    Recent Results (from the past 2160 hour(s))  Comprehensive metabolic panel     Status: Abnormal   Collection Time: 10/15/19  4:01 PM  Result Value Ref Range   Sodium 138 135 - 145 mmol/L   Potassium 4.3 3.5 - 5.1 mmol/L   Chloride 106 98 - 111 mmol/L   CO2 23 22 - 32 mmol/L   Glucose, Bld 110 (H) 70 - 99 mg/dL   BUN 17 6 - 20 mg/dL   Creatinine, Ser 9.89 0.61 - 1.24 mg/dL   Calcium 9.2 8.9 - 21.1 mg/dL   Total Protein 7.7 6.5 - 8.1 g/dL   Albumin 4.2 3.5 - 5.0 g/dL   AST 38 15 - 41 U/L   ALT 49 (H) 0 - 44 U/L   Alkaline Phosphatase  79 38 - 126 U/L   Total Bilirubin 0.7 0.3 - 1.2 mg/dL   GFR calc non Af Amer >60 >60 mL/min   GFR calc Af Amer >60 >60 mL/min   Anion gap 9 5 - 15    Comment: Performed at [facility], [address] [place], [place] [address]  CBC     Status: Abnormal   Collection Time: 10/15/19  4:01 PM  Result Value Ref Range   WBC 7.1 4.0 - 10.5 K/uL   RBC 4.63 4.22 - 5.81 MIL/uL   Hemoglobin 12.9 (L) 13.0 - 17.0 g/dL   HCT 24.0 97.3 - 53.2 %   MCV 87.0 80.0 - 100.0 fL   MCH 27.9 26.0 - 34.0 pg   MCHC 32.0 30.0 - 36.0 g/dL   RDW 99.2 42.6 - 83.4 %   Platelets 289 150 - 400 K/uL   nRBC 0.0 0.0 - 0.2 %    Comment: Performed at [facility], [address] [place], [place] [address]  Novel Coronavirus, NAA ([facility])     Status: None   Collection Time: 10/28/19  3:08 PM   Specimen: Nasopharyngeal(NP) swabs in vial transport medium   NASOPHARYNGE  TESTING  Result Value Ref Range   SARS-CoV-2, NAA Not Detected Not Detected    Comment: This nucleic acid amplification test was developed and its performance characteristics determined by [employer]. Nucleic acid amplification tests include RT-PCR and TMA. This test has not been FDA cleared or  approved. This test has been authorized by FDA under an Emergency Use Authorization (EUA). This test is only authorized for the duration of time the declaration that circumstances exist justifying the authorization of the emergency use of in vitro diagnostic tests for detection of SARS-CoV-2 virus and/or diagnosis of COVID-19 infection under section 564(b)(1) of the Act, 21 U.S.C. 297LGX-2(J) (1), unless the authorization is terminated or revoked sooner. When diagnostic testing is negative, the possibility of a false negative result should be considered in the context of a patient's recent exposures and the presence of clinical signs and symptoms consistent with COVID-19. An individual without symptoms of COVID-19 and who is not shedding SARS-CoV-2 virus wo uld expect to have a negative (not detected) result in this assay.   Basic metabolic panel     Status: Abnormal   Collection Time: 11/27/19  4:49 PM  Result Value Ref Range   Sodium 138 135 - 145 mmol/L   Potassium 4.2 3.5 - 5.1 mmol/L   Chloride 105 98 - 111 mmol/L   CO2 25 22 - 32 mmol/L   Glucose, Bld 129 (H) 70 - 99 mg/dL    Comment: Glucose reference range applies only to samples taken after fasting for at least 8 hours.   BUN 16 6 - 20 mg/dL   Creatinine, Ser 1.94 0.61 - 1.24 mg/dL   Calcium 9.1 8.9 - 17.4 mg/dL   GFR calc non Af Amer >60 >60 mL/min   GFR calc Af Amer >60 >60 mL/min   Anion gap 8 5 - 15    Comment: Performed at [facility], [address] [place], [place] [address]  CBC     Status: Abnormal   Collection Time: 11/27/19  4:49 PM  Result Value Ref Range   WBC 6.8 4.0 - 10.5 K/uL   RBC 4.51 4.22 - 5.81 MIL/uL   Hemoglobin 12.6 (L) 13.0 - 17.0 g/dL   HCT 81.8 (L) 56.3 - 14.9 %   MCV 85.6 80.0 -  100.0 fL   MCH 27.9 26.0 - 34.0 pg   MCHC 32.6 30.0 - 36.0 g/dL   RDW 93.9 03.0 - 09.2 %   Platelets 236 150 - 400 K/uL   nRBC 0.0 0.0 - 0.2 %    Comment: Performed at [facility], [address] [place], [place] [address]  Troponin I (High Sensitivity)     Status: None   Collection Time: 11/27/19  4:49 PM  Result Value Ref Range   Troponin I (High Sensitivity) 3 <18 ng/L    Comment: (NOTE) Elevated high sensitivity troponin I (hsTnI) values and significant  changes across serial measurements may suggest ACS but many other  chronic and acute conditions are known to elevate hsTnI results.  Refer to the "Links" section for chest pain algorithms and additional  guidance. Performed at [facility], [address] [place], [place] [address]   Troponin I (High Sensitivity)     Status: None   Collection Time: 11/27/19  7:07 PM  Result Value Ref Range   Troponin I (High Sensitivity) 4 <18 ng/L    Comment: (NOTE) Elevated high sensitivity troponin I (hsTnI) values and significant  changes across serial measurements may suggest ACS but many other  chronic and acute conditions are known to elevate hsTnI results.  Refer to the "Links" section for chest pain algorithms and additional  guidance. Performed at [facility], [address] [place], [place] [address]       Constitutional:  Wt 242 lb (109.8 kg)   BMI 39.06 kg/m    Musculoskeletal: Strength & Muscle Tone: within normal limits Gait & Station: normal Patient leans: N/A  Psychiatric Specialty Exam: Physical Exam  ROS  Weight 242 lb (109.8 kg).Body mass index is 39.06 kg/m.  General Appearance: Casual  Eye Contact:  Fair  Speech:  Normal Rate  Volume:  Normal  Mood:  Anxious  Affect:  Constricted  Thought Process:  Goal Directed  Orientation:  Full (Time, Place, and Person)  Thought Content:  Rumination  Suicidal Thoughts:  No  Homicidal Thoughts:  No  Memory:  Immediate;   Good Recent;   Good Remote;   Fair  Judgement:  Intact  Insight:  Present  Psychomotor Activity:  NA  Concentration:  Concentration: Fair and Attention Span: Fair  Recall:  Good  Fund of  Knowledge:  Good  Language:  Fair  Akathisia:  No  Handed:  Right  AIMS (if indicated):     Assets:  Communication Skills Desire for Improvement Housing Resilience Social Support Talents/Skills Transportation  ADL's:  Intact  Cognition:  WNL  Sleep:   fair     Assessment and Plan: Jack Flowers is 44 year old man with history of anxiety and panic attacks.  He had tried Zoloft for 4 weeks and then Lexapro for few days with poor outcome.  Currently he is taking lorazepam 1 mg prescribed up to 3 times a day and nortriptyline 20 mg to help his IBS symptoms.  I had a long discussion with the patient and his wife about his current symptoms.  We discussed benzodiazepine dependence tolerance and withdrawal.  He had seen some improvement since he taking nortriptyline to help his IBS.  I recommend that he should try higher dose of nortriptyline to target her symptoms of anxiety and nervousness.  And take lorazepam 0.5 mg only when he had panic attacks.  Recommended not to take trazodone since his sleep is better with nortriptyline.  We discussed that he  need to cut down and stop drinking as medication can cause interaction with alcohol.  I do believe he need to see therapy for coping skills which she agree.  We also talked about trying beta-blocker to replace one of his antihypertensive.  I suggested that he should consult with his cardiologist to try Inderal to help physiological symptoms of panic attacks.  He agreed to discuss with his cardiologist.  Currently he is taking amlodipine and Benicar.  Encourage healthy lifestyle and take breathing exercise to relieve his anxiety.  Discussed safety concern that anytime having active suicidal thoughts or homicidal thoughts then he need to call 911 or go to local emergency room.  Follow-up in 4 weeks.  I have provided nurse triage phone information if he has any further question.  We will schedule to see him for therapy.  Follow Up Instructions:    I discussed the  assessment and treatment plan with the patient. The patient was provided an opportunity to ask questions and all were answered. The patient agreed with the plan and demonstrated an understanding of the instructions.   The patient was advised to call back or seek an in-person evaluation if the symptoms worsen or if the condition fails to improve as anticipated.  I provided 55 minutes of non-face-to-face time during this encounter.   Kathlee Nations, MD

## 2020-01-13 ENCOUNTER — Inpatient Hospital Stay: Payer: BLUE CROSS/BLUE SHIELD | Attending: Oncology

## 2020-01-13 ENCOUNTER — Other Ambulatory Visit: Payer: BLUE CROSS/BLUE SHIELD

## 2020-01-14 ENCOUNTER — Inpatient Hospital Stay: Payer: BLUE CROSS/BLUE SHIELD | Admitting: Oncology

## 2020-01-21 ENCOUNTER — Inpatient Hospital Stay: Payer: BLUE CROSS/BLUE SHIELD

## 2020-01-22 ENCOUNTER — Telehealth: Payer: Self-pay | Admitting: Oncology

## 2020-01-22 NOTE — Telephone Encounter (Signed)
Patient missed lab appt on 01-21-20. Writer phoned patient to reschedule this and patient stated that he was out of town. Lab and virtual visit with MD are rescheduled with patient.

## 2020-01-23 ENCOUNTER — Inpatient Hospital Stay: Payer: BLUE CROSS/BLUE SHIELD | Admitting: Oncology

## 2020-01-30 ENCOUNTER — Ambulatory Visit (HOSPITAL_COMMUNITY): Payer: BLUE CROSS/BLUE SHIELD | Admitting: Licensed Clinical Social Worker

## 2020-01-31 ENCOUNTER — Inpatient Hospital Stay: Payer: BLUE CROSS/BLUE SHIELD

## 2020-02-05 ENCOUNTER — Other Ambulatory Visit: Payer: Self-pay

## 2020-02-05 ENCOUNTER — Ambulatory Visit (INDEPENDENT_AMBULATORY_CARE_PROVIDER_SITE_OTHER): Payer: BLUE CROSS/BLUE SHIELD | Admitting: Licensed Clinical Social Worker

## 2020-02-05 ENCOUNTER — Encounter (HOSPITAL_COMMUNITY): Payer: Self-pay | Admitting: Licensed Clinical Social Worker

## 2020-02-05 DIAGNOSIS — F410 Panic disorder [episodic paroxysmal anxiety] without agoraphobia: Secondary | ICD-10-CM

## 2020-02-05 DIAGNOSIS — F411 Generalized anxiety disorder: Secondary | ICD-10-CM
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
## 2020-02-07 ENCOUNTER — Inpatient Hospital Stay: Payer: BLUE CROSS/BLUE SHIELD | Attending: Oncology | Admitting: Oncology

## 2020-02-07 ENCOUNTER — Other Ambulatory Visit: Payer: Self-pay

## 2020-02-20 ENCOUNTER — Other Ambulatory Visit: Payer: Self-pay

## 2020-02-20 ENCOUNTER — Telehealth (INDEPENDENT_AMBULATORY_CARE_PROVIDER_SITE_OTHER): Payer: BLUE CROSS/BLUE SHIELD | Admitting: Psychiatry

## 2020-02-20 DIAGNOSIS — F411 Generalized anxiety disorder: Secondary | ICD-10-CM

## 2020-02-20 DIAGNOSIS — F410 Panic disorder [episodic paroxysmal anxiety] without agoraphobia: Secondary | ICD-10-CM | POA: Diagnosis not present

## 2020-02-20 MED ORDER — NORTRIPTYLINE HCL 75 MG PO CAPS: 75.0000 mg | ORAL_CAPSULE | Freq: Every day | ORAL | 0 refills | Status: DC

## 2020-02-20 NOTE — Progress Notes (Signed)
Virtual Visit via Telephone Note  I connected with Jack Flowers on 02/20/20 at  3:00 PM EDT by telephone and verified that I am speaking with the correct person using two identifiers.   Patient location; [place], [place] discussed the limitations, risks, security and privacy concerns of performing an evaluation and management service by telephone and the availability of in person appointments. I also discussed with the patient that there may be a patient responsible charge related to this service. The patient expressed understanding and agreed to proceed.   History of Present Illness: Patient is evaluated by phone session.  Is a 44 year old married employed man who was referred from his PCP for the management of anxiety and panic attacks.  He was taking low-dose nortriptyline prescribed by PCP.  He also tried Lexapro but after 2 weeks he stopped because of the headaches.  We recommended to try nortriptyline to 50 mg and he has seen much improvement.  He was taking lorazepam up to 3 mg a day but is still having panic attacks.  I recommend to cut it down and he is taking 1 mg which ires pscribed by PCP.  He still feels some time anxious and nervous but he started driving and going to the public places without any significant panic attacks.  He does have some time chest tightness and shortness of breath but denies any leg shaking and able to function.  Currently he is in [place] and now he has decided to move back from [place] to [place].  Patient lives most of his life in [place].  He feels more comfortable going back to [place].  He has no tremors, shakes or any EPS.     Past Psychiatric History: H/O anxiety and panic attacks.  H/O in ER multiple times for chest pain anxiety attacks.  PCP tried Zoloft for 4 weeks but stopped due to headaches.  Tried Lexapro for few days.  Given trazodone to help sleep.  Recently GI prescribed nortriptyline to help IBS.  No history of suicidal attempt, inpatient  treatment, abuse, mania or psychosis.   Psychiatric Specialty Exam: Physical Exam  Review of Systems  There were no vitals taken for this visit.There is no height or weight on file to calculate BMI.  General Appearance: NA  Eye Contact:  NA  Speech:  Normal Rate  Volume:  Normal  Mood:  Anxious  Affect:  NA  Thought Process:  Goal Directed  Orientation:  Full (Time, Place, and Person)  Thought Content:  Rumination  Suicidal Thoughts:  No  Homicidal Thoughts:  No  Memory:  Immediate;   Good Recent;   Good Remote;   Good  Judgement:  Good  Insight:  Present  Psychomotor Activity:  NA  Concentration:  Concentration: Good and Attention Span: Good  Recall:  Good  Fund of Knowledge:  Good  Language:  Good  Akathisia:  No  Handed:  Right  AIMS (if indicated):     Assets:  Communication Skills Desire for Improvement Housing Resilience Social Support Talents/Skills Transportation  ADL's:  Intact  Cognition:  WNL  Sleep:   better from past      Assessment and Plan: Panic attacks, generalized anxiety disorder.  Patient doing better since we increased the nortriptyline.  He like to increase further since he noticed improvement but he still have residual anxiety.  He is taking Ativan 1 mg as compared to 2-3 times a day.  I recommend to try nortriptyline 75 mg at bedtime to  help with his anxiety.  Patient like to keep appointment since it is virtually until he find a psychiatrist in [place].  Discussed medication side effects and benefits.  He is hoping to move to [place] completely in the next few weeks.  He is coming back to [place] next week and then he will take his family to follow-up.  I recommend to call us back if is any question or any concerns.  Follow-up in 3 months.  Follow Up Instructions:    I discussed the assessment and treatment plan with the patient. The patient was provided an opportunity to ask questions and all were answered. The patient agreed with  the plan and demonstrated an understanding of the instructions.   The patient was advised to call back or seek an in-person evaluation if the symptoms worsen or if the condition fails to improve as anticipated.  I provided 20 minutes of non-face-to-face time during this encounter.   Cleotis Nipper, MD

## 2020-02-25 ENCOUNTER — Telehealth (HOSPITAL_COMMUNITY): Payer: Self-pay | Admitting: Licensed Clinical Social Worker

## 2020-02-25 ENCOUNTER — Ambulatory Visit (HOSPITAL_COMMUNITY): Payer: BLUE CROSS/BLUE SHIELD | Admitting: Licensed Clinical Social Worker

## 2020-02-25 ENCOUNTER — Other Ambulatory Visit: Payer: Self-pay

## 2020-02-25 NOTE — Telephone Encounter (Signed)
Pt did not present for his virtual therapy session. Called his phone and left message on his vm. He did not join the session. Jack Flowers, lcas

## 2020-05-21 ENCOUNTER — Encounter (HOSPITAL_COMMUNITY): Payer: Self-pay | Admitting: Psychiatry

## 2020-05-21 ENCOUNTER — Other Ambulatory Visit: Payer: Self-pay

## 2020-05-21 ENCOUNTER — Telehealth (INDEPENDENT_AMBULATORY_CARE_PROVIDER_SITE_OTHER): Payer: BLUE CROSS/BLUE SHIELD | Admitting: Psychiatry

## 2020-05-21 DIAGNOSIS — F411 Generalized anxiety disorder: Secondary | ICD-10-CM

## 2020-05-21 DIAGNOSIS — F410 Panic disorder [episodic paroxysmal anxiety] without agoraphobia: Secondary | ICD-10-CM

## 2020-05-21 MED ORDER — NORTRIPTYLINE HCL 75 MG PO CAPS: 75.0000 mg | ORAL_CAPSULE | Freq: Every day | ORAL | 0 refills | Status: AC

## 2020-05-21 NOTE — Progress Notes (Signed)
Virtual Visit via Telephone Note  I connected with Jack Flowers on 05/21/20 at  3:00 PM EDT by telephone and verified that I am speaking with the correct person using two identifiers.  Location: Patient: home Provider: home work   I discussed the limitations, risks, security and privacy concerns of performing an evaluation and management service by telephone and the availability of in person appointments. I also discussed with the patient that there may be a patient responsible charge related to this service. The patient expressed understanding and agreed to proceed.   History of Present Illness: Patient is evaluated by phone session.  On the last visit we increased nortriptyline to 75 mg at bedtime.  He is sleeping much better.  He denies any major panic attack however he does take on 1 mg Ativan when he feels very nervous and anxious.  He was taking lorazepam up to 3 mg but gradually he has been cutting down.  He is now finally moved to [place] and he is very happy.  He is close to his family members.  He feel that he belongs to [place] and things are much better.  He denies any crying spells, feeling of hopelessness, worthlessness or any suicidal thoughts.  He started driving and going to public places.  He feels he is able to function.  He is trying to lose weight.  He is no longer taking metformin because his physician said that his blood sugar is okay and does not need metformin.  We recall last hemoglobin A1c was 6.5.  He is also not taking any over-the-counter medicine for insomnia.  He has no tremors, shakes or any EPS.    Past Psychiatric History: H/O anxiety and panic attacks. H/O in ER multiple times for chest pain anxiety attacks. PCP tried Zoloft and we tried Lexapro but stopped due to headache. Trazodone helped sleep. Recently GI prescribed nortriptyline to help IBS. No history of suicidal attempt, inpatient treatment, abuse, mania or psychosis.    Psychiatric Specialty  Exam: Physical Exam  Review of Systems  Weight 242 lb (109.8 kg).There is no height or weight on file to calculate BMI.  General Appearance: NA  Eye Contact:  NA  Speech:  Normal Rate  Volume:  Normal  Mood:  Euthymic  Affect:  NA  Thought Process:  Goal Directed  Orientation:  Full (Time, Place, and Person)  Thought Content:  Logical  Suicidal Thoughts:  No  Homicidal Thoughts:  No  Memory:  Immediate;   Good Recent;   Good Remote;   Good  Judgement:  Intact  Insight:  Present  Psychomotor Activity:  NA  Concentration:  Concentration: Good and Attention Span: Good  Recall:  Good  Fund of Knowledge:  Good  Language:  Good  Akathisia:  No  Handed:  Right  AIMS (if indicated):     Assets:  Communication Skills Desire for Improvement Housing Resilience Social Support Transportation  ADL's:  Intact  Cognition:  WNL  Sleep:   ok      Assessment and Plan: Panic attacks.  Generalized anxiety disorder.  Patient doing better with increased dose of nortriptyline.  He also moved to [place] and happy about his decision.  He is taking lorazepam 1 mg as needed when he feels very nervous and anxious.  Discussed medication side effects and benefits.  Recommended to find a psychiatrist in [place] and once he has the name and contact information and consent I am happy to send the records to the new  physician.  In the meantime if you have any question or any concern.  He can call us.  Follow Up Instructions:    I discussed the assessment and treatment plan with the patient. The patient was provided an opportunity to ask questions and all were answered. The patient agreed with the plan and demonstrated an understanding of the instructions.   The patient was advised to call back or seek an in-person evaluation if the symptoms worsen or if the condition fails to improve as anticipated.  I provided 15 minutes of non-face-to-face time during this encounter.   Cleotis Nipper, MD

## 2020-10-17 IMAGING — CR DG CHEST 2V
2 series · 2 of 2 positions shown · non-contrast
Comparison: None.

CLINICAL DATA: Sudden onset of tingling of the hands and face with
shortness of breath chest pain.

EXAM:
CHEST - 2 VIEW

[chest pa]
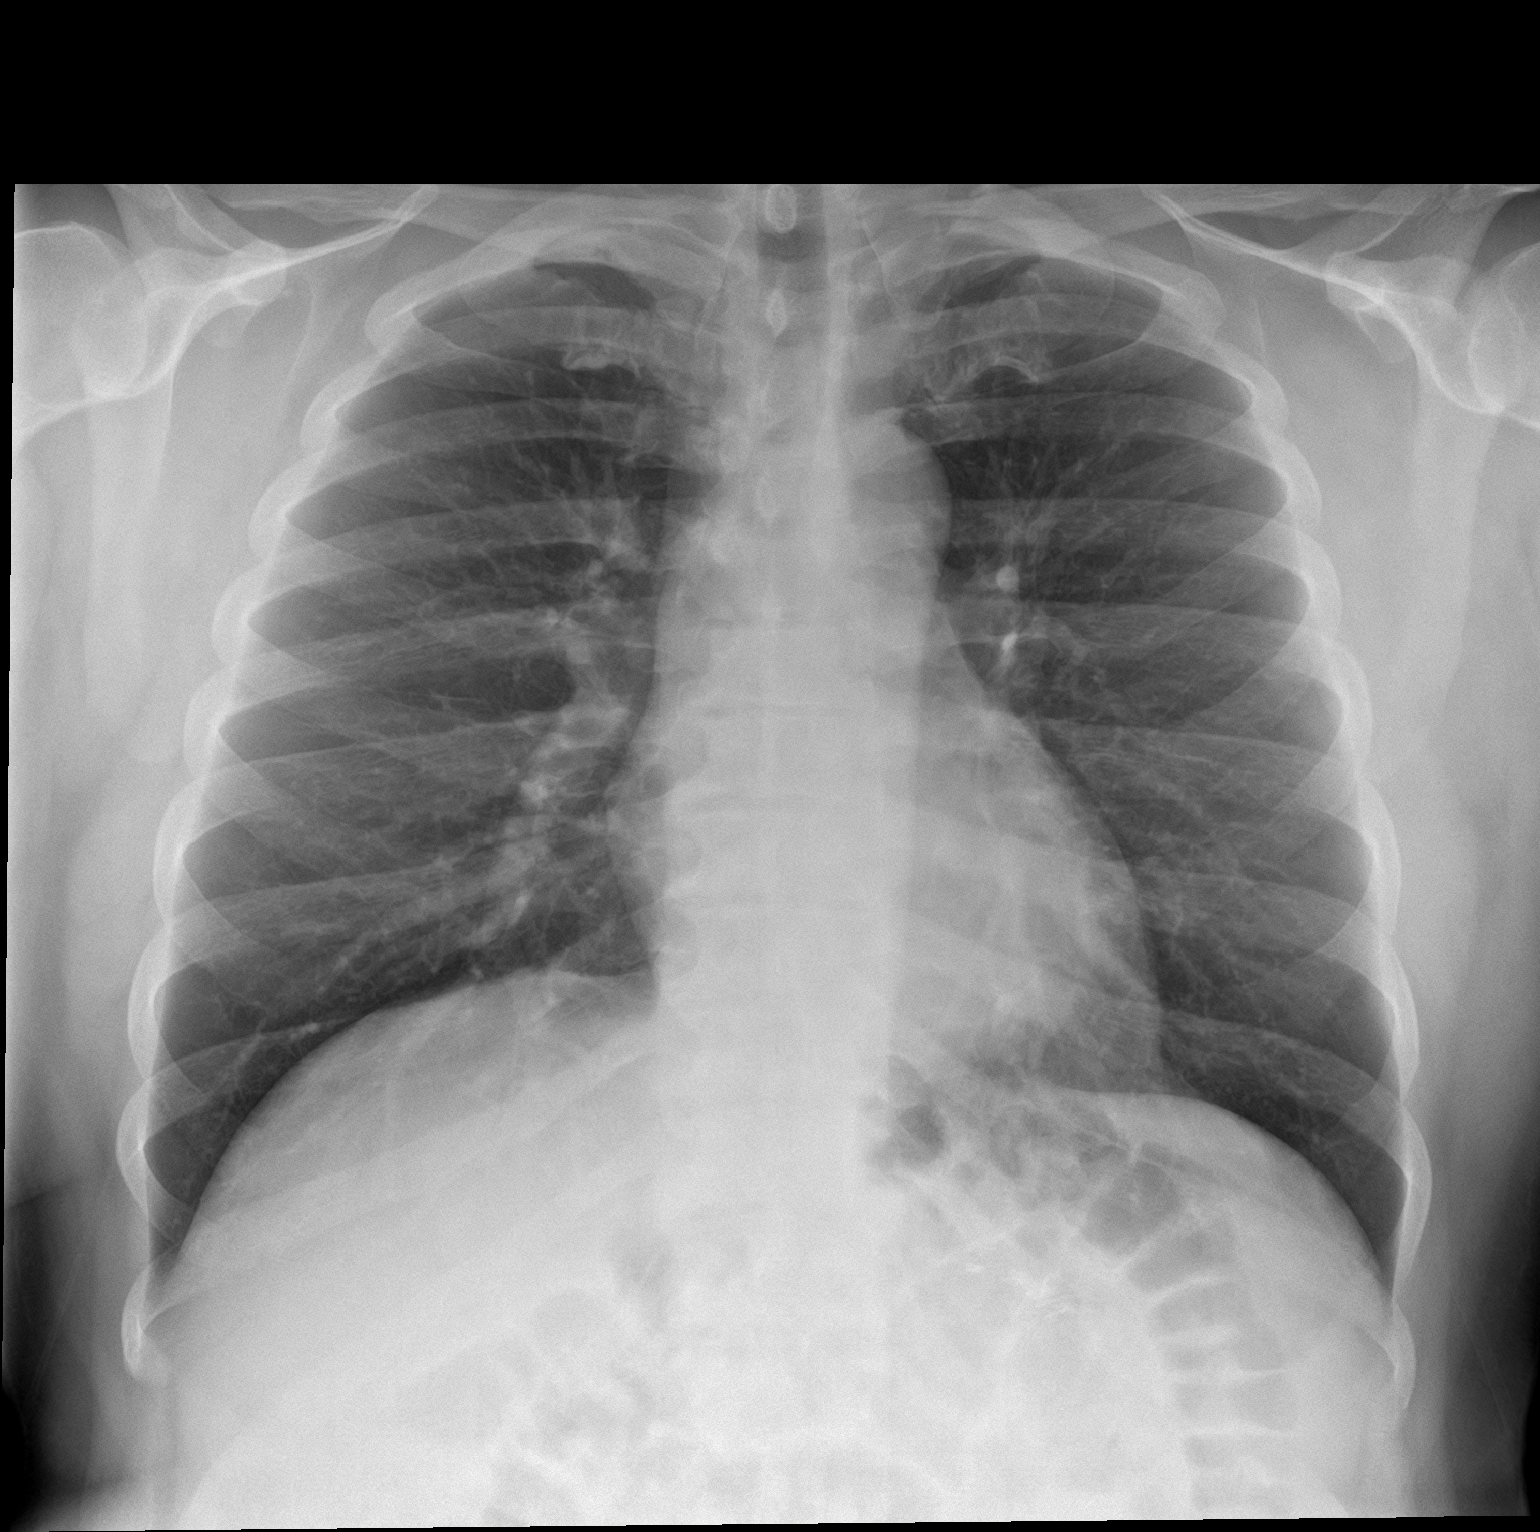

[chest lat]
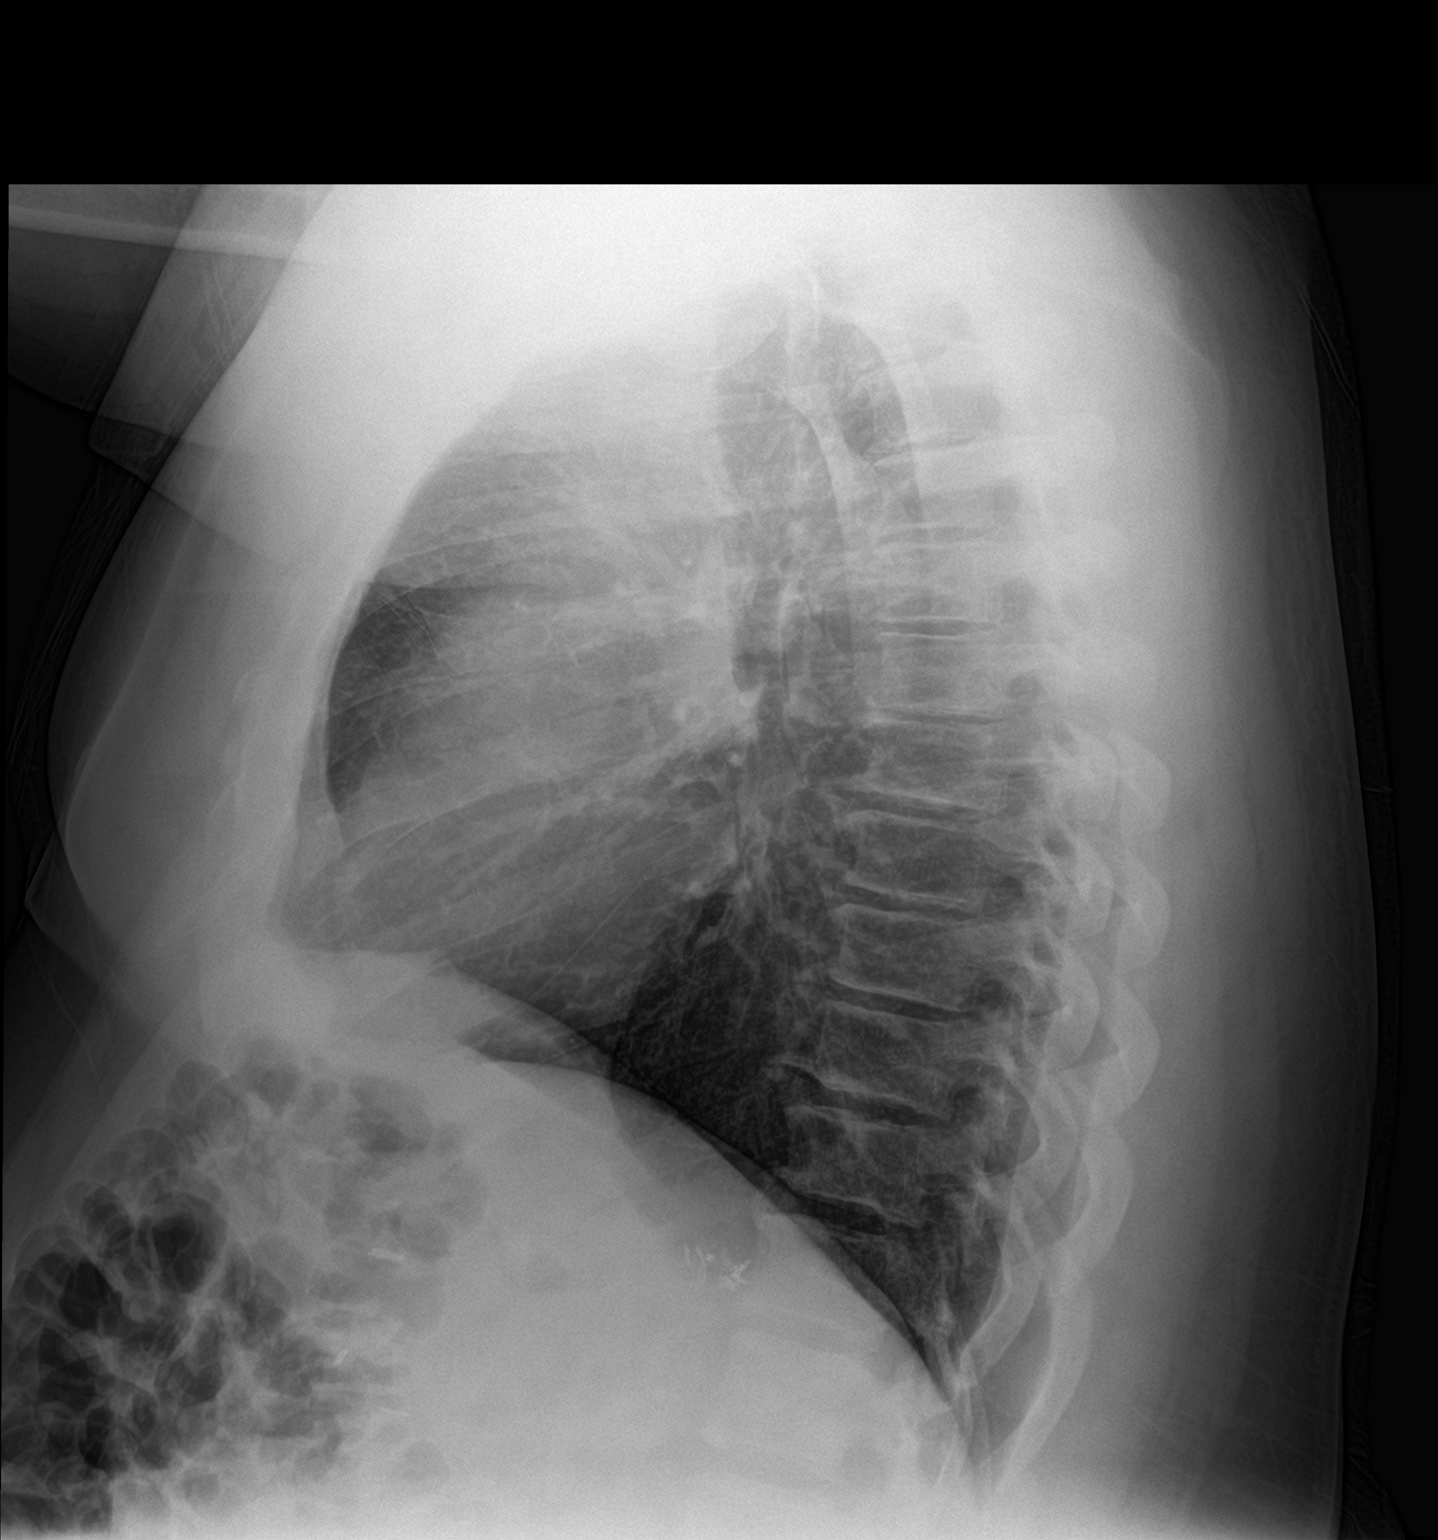

[2 of 2 positions shown; findings below may reference images not displayed]

FINDINGS: Heart size is normal. Mediastinal shadows are normal. The lungs are
clear. No bronchial thickening. No infiltrate, mass, effusion or
collapse. Pulmonary vascularity is normal. No bony abnormality.
IMPRESSION: Normal chest
